# Patient Record
Sex: Male | Born: 1990 | Race: Black or African American | Hispanic: No | Marital: Single | Smoking: Current every day smoker
Health system: Southern US, Community
[De-identification: ages and names within clinical notes are randomized; demographics above are authoritative.]

## PROBLEM LIST (undated history)

## (undated) HISTORY — PX: MULTIPLE TOOTH EXTRACTIONS: SHX2053

---

## 2013-11-22 ENCOUNTER — Emergency Department (HOSPITAL_COMMUNITY)
Admission: EM | Admit: 2013-11-22 | Discharge: 2013-11-22 | Disposition: A | Discharge status: Home / Self Care | Payer: BC Managed Care – PPO | Attending: Emergency Medicine | Admitting: Emergency Medicine

## 2013-11-22 ENCOUNTER — Encounter (HOSPITAL_COMMUNITY): Payer: Self-pay | Admitting: Emergency Medicine

## 2013-11-22 DIAGNOSIS — S91332A Puncture wound without foreign body, left foot, initial encounter: Secondary | ICD-10-CM | POA: Insufficient documentation

## 2013-11-22 DIAGNOSIS — Z23 Encounter for immunization: Secondary | ICD-10-CM | POA: Insufficient documentation

## 2013-11-22 DIAGNOSIS — Y9389 Activity, other specified: Secondary | ICD-10-CM | POA: Insufficient documentation

## 2013-11-22 DIAGNOSIS — Y9289 Other specified places as the place of occurrence of the external cause: Secondary | ICD-10-CM | POA: Diagnosis not present

## 2013-11-22 DIAGNOSIS — W450XXA Nail entering through skin, initial encounter: Secondary | ICD-10-CM | POA: Diagnosis not present

## 2013-11-22 MED ORDER — CEPHALEXIN 500 MG PO CAPS: 500.0000 mg | ORAL_CAPSULE | Freq: Four times a day (QID) | ORAL | Status: DC

## 2013-11-22 MED ORDER — TETANUS-DIPHTH-ACELL PERTUSSIS 5-2.5-18.5 LF-MCG/0.5 IM SUSP
0.5000 mL | Freq: Once | INTRAMUSCULAR | Status: AC
  Administered 2013-11-22: 0.5 mL via INTRAMUSCULAR
  Filled 2013-11-22: qty 0.5

## 2013-11-22 NOTE — ED Notes (Signed)
Pt presents with puncture wound to L foot after stepping on board with rusted nail. No FB retained per pt. Tdap unkn

## 2013-11-22 NOTE — ED Provider Notes (Signed)
CSN: [id number]     Arrival date & time 11/22/13  1936 History   First MD Initiated Contact with Patient 11/22/13 2036     This chart was scribed for non-physician practitioner, Antony MaduraKelly Triva Hueber, PA-C working with Audree CamelScott T Goldston, MD by Arlan OrganAshley Leger, ED Scribe. This patient was seen in room WTR5/WTR5 and the patient's care was started at 9:04 PM.   Chief Complaint  Patient presents with  . Puncture Wound   The history is provided by the patient. No language interpreter was used.    HPI Comments: Lee Scott is a 23 y.o. male who presents to the Emergency Department here for a puncture wound to the bottom of the L foot sustained just prior to arrival. Pt states he stepped on a rusty nail with shoes on attached to a board. He admits to mild, nonradiating pain to the site. No pallor, numbness, or weakness. Pt unaware of last tetanus shot. No known allergies to medications. No other concerns this visit.  History reviewed. No pertinent past medical history. History reviewed. No pertinent past surgical history. No family history on file. History  Substance Use Topics  . Smoking status: Never Smoker   . Smokeless tobacco: Not on file  . Alcohol Use: Yes    Review of Systems  Skin: Positive for wound.  All other systems reviewed and are negative.   Allergies  Review of patient's allergies indicates no known allergies.  Home Medications   Prior to Admission medications   Medication Sig Start Date End Date Taking? Authorizing Provider  cephALEXin (KEFLEX) 500 MG capsule Take 1 capsule (500 mg total) by mouth 4 (four) times daily. 11/22/13   Antony MaduraKelly Marcile Fuquay, PA-C   Triage Vitals: BP 170/92  Pulse 93  Temp(Src) 98.8 F (37.1 C) (Oral)  Resp 16  Ht 6' (1.829 m)  Wt 155 lb (70.308 kg)  BMI 21.02 kg/m2  SpO2 100%   Physical Exam  Nursing note and vitals reviewed. Constitutional: He is oriented to person, place, and time. He appears well-developed and well-nourished. No distress.   Nontoxic/nonseptic appearing  HENT:  Head: Normocephalic and atraumatic.  Eyes: Conjunctivae and EOM are normal. No scleral icterus.  Neck: Normal range of motion. Neck supple.  Cardiovascular: Normal rate, regular rhythm and intact distal pulses.   DP and PT pulses 2+ in LLE  Pulmonary/Chest: Effort normal. No respiratory distress.  Musculoskeletal: Normal range of motion. He exhibits tenderness.       Left foot: He exhibits tenderness. He exhibits normal range of motion, no bony tenderness, normal capillary refill, no crepitus and no deformity.       Feet:  Mild TTP at puncture site; punctate wound to plantar L midfoot. No active bleeding.  Neurological: He is alert and oriented to person, place, and time. He exhibits normal muscle tone. Coordination normal.  Sensation to light touch intact in LLE. Patient able to wiggle all toes of L foot.  Skin: Skin is warm and dry. No rash noted. He is not diaphoretic. No erythema. No pallor.  Psychiatric: He has a normal mood and affect. His behavior is normal.    ED Course  Procedures (including critical care time)  DIAGNOSTIC STUDIES: Oxygen Saturation is 100% on RA, Normal by my interpretation.    COORDINATION OF CARE: 9:04 PM- Will start on course of Keflex. Discussed treatment plan with pt at bedside and pt agreed to plan.     Labs Review Labs Reviewed - No data to display  Imaging Review  No results found.   EKG Interpretation None      MDM   Final diagnoses:  Puncture wound of foot, left, initial encounter    23 year old male presents to the emergency department for further evaluation of puncture wound to his left foot. Patient neurovascularly intact. No sensory deficits appreciated. Patient ambulatory with normal gait. Puncture site appreciated without foreign bodies. There is mild tenderness to palpation at puncture site. No evidence of secondary infection. No indication for emergent imaging. Tetanus updated in ED.  Patient will be discharged on course of Keflex given dirty nature of wound. Wound care provided in ED prior to discharge. Return precautions discussed and provided. Patient agreeable to plan with no unaddressed concerns.  I personally performed the services described in this documentation, which was scribed in my presence. The recorded information has been reviewed and is accurate.    Filed Vitals:   11/22/13 2032  BP: 170/92  Pulse: 93  Temp: 98.8 F (37.1 C)  TempSrc: Oral  Resp: 16  Height: 6' (1.829 m)  Weight: 155 lb (70.308 kg)  SpO2: 100%     Antony MaduraKelly Inessa Wardrop, PA-C 11/22/13 2109

## 2013-11-22 NOTE — Discharge Instructions (Signed)
Puncture Wound °A puncture wound is an injury that extends through all layers of the skin and into the tissue beneath the skin (subcutaneous tissue). Puncture wounds become infected easily because germs often enter the body and go beneath the skin during the injury. Having a deep wound with a small entrance point makes it difficult for your caregiver to adequately clean the wound. This is especially true if you have stepped on a nail and it has passed through a dirty shoe or other situations where the wound is obviously contaminated. °CAUSES  °Many puncture wounds involve glass, nails, splinters, fish hooks, or other objects that enter the skin (foreign bodies). A puncture wound may also be caused by a human bite or animal bite. °DIAGNOSIS  °A puncture wound is usually diagnosed by your history and a physical exam. You may need to have an X-ray or an ultrasound to check for any foreign bodies still in the wound. °TREATMENT  °· Your caregiver will clean the wound as thoroughly as possible. Depending on the location of the wound, a bandage (dressing) may be applied. °· Your caregiver might prescribe antibiotic medicines. °· You may need a follow-up visit to check on your wound. Follow all instructions as directed by your caregiver. °HOME CARE INSTRUCTIONS  °· Change your dressing once per day, or as directed by your caregiver. If the dressing sticks, it may be removed by soaking the area in water. °· If your caregiver has given you follow-up instructions, it is very important that you return for a follow-up appointment. Not following up as directed could result in a chronic or permanent injury, pain, and disability. °· Only take over-the-counter or prescription medicines for pain, discomfort, or fever as directed by your caregiver. °· If you are given antibiotics, take them as directed. Finish them even if you start to feel better. °You may need a tetanus shot if: °· You cannot remember when you had your last tetanus  shot. °· You have never had a tetanus shot. °If you got a tetanus shot, your arm may swell, get red, and feel warm to the touch. This is common and not a problem. If you need a tetanus shot and you choose not to have one, there is a rare chance of getting tetanus. Sickness from tetanus can be serious. °You may need a rabies shot if an animal bite caused your puncture wound. °SEEK MEDICAL CARE IF:  °· You have redness, swelling, or increasing pain in the wound. °· You have red streaks going away from the wound. °· You notice a bad smell coming from the wound or dressing. °· You have yellowish-white fluid (pus) coming from the wound. °· You are treated with an antibiotic for infection, but the infection is not getting better. °· You notice something in the wound, such as rubber from your shoe, cloth, or another object. °· You have a fever. °· You have severe pain. °· You have difficulty breathing. °· You feel dizzy or faint. °· You cannot stop vomiting. °· You lose feeling, develop numbness, or cannot move a limb below the wound. °· Your symptoms worsen. °MAKE SURE YOU: °· Understand these instructions. °· Will watch your condition. °· Will get help right away if you are not doing well or get worse. °Document Released: 10/27/2004 Document Revised: 04/11/2011 Document Reviewed: 07/06/2010 °[employer]® Patient Information ©2015 [employer]. This information is not intended to replace advice given to you by your health care provider. Make sure you discuss any questions you   have with your health care provider. ° °

## 2013-11-24 NOTE — ED Provider Notes (Signed)
Medical screening examination/treatment/procedure(s) were performed by non-physician practitioner and as supervising physician I was immediately available for consultation/collaboration.   EKG Interpretation None        Audree CamelScott T Jeyson Deshotel, MD 11/24/13 2345

## 2016-08-14 ENCOUNTER — Encounter (HOSPITAL_COMMUNITY): Payer: Self-pay | Admitting: Emergency Medicine

## 2016-08-14 ENCOUNTER — Emergency Department (HOSPITAL_COMMUNITY): Payer: Self-pay

## 2016-08-14 ENCOUNTER — Emergency Department (HOSPITAL_COMMUNITY)
Admission: EM | Admit: 2016-08-14 | Discharge: 2016-08-14 | Disposition: A | Discharge status: Home / Self Care | Payer: Self-pay | Attending: Emergency Medicine | Admitting: Emergency Medicine

## 2016-08-14 DIAGNOSIS — S82851A Displaced trimalleolar fracture of right lower leg, initial encounter for closed fracture: Secondary | ICD-10-CM | POA: Insufficient documentation

## 2016-08-14 DIAGNOSIS — Y999 Unspecified external cause status: Secondary | ICD-10-CM | POA: Insufficient documentation

## 2016-08-14 DIAGNOSIS — Y929 Unspecified place or not applicable: Secondary | ICD-10-CM | POA: Insufficient documentation

## 2016-08-14 DIAGNOSIS — Y9351 Activity, roller skating (inline) and skateboarding: Secondary | ICD-10-CM | POA: Insufficient documentation

## 2016-08-14 DIAGNOSIS — W010XXA Fall on same level from slipping, tripping and stumbling without subsequent striking against object, initial encounter: Secondary | ICD-10-CM | POA: Insufficient documentation

## 2016-08-14 DIAGNOSIS — S9304XA Dislocation of right ankle joint, initial encounter: Secondary | ICD-10-CM | POA: Insufficient documentation

## 2016-08-14 MED ORDER — HYDROMORPHONE HCL 1 MG/ML IJ SOLN
1.0000 mg | Freq: Once | INTRAMUSCULAR | Status: AC
  Administered 2016-08-14: 1 mg via INTRAVENOUS
  Filled 2016-08-14: qty 1

## 2016-08-14 MED ORDER — HYDROCODONE-ACETAMINOPHEN 5-325 MG PO TABS: 1.0000 | ORAL_TABLET | Freq: Three times a day (TID) | ORAL | 0 refills | Status: AC | PRN

## 2016-08-14 MED ORDER — ACETAMINOPHEN 500 MG PO TABS: 1000.0000 mg | ORAL_TABLET | Freq: Three times a day (TID) | ORAL | 0 refills | Status: AC

## 2016-08-14 MED ORDER — LIDOCAINE HCL 2 % IJ SOLN
20.0000 mL | Freq: Once | INTRAMUSCULAR | Status: AC
  Administered 2016-08-14: 400 mg
  Filled 2016-08-14: qty 20

## 2016-08-14 MED ORDER — HYDROMORPHONE HCL 1 MG/ML IJ SOLN
1.0000 mg | Freq: Once | INTRAMUSCULAR | Status: AC
Start: 2016-08-14 — End: 2016-08-14
  Administered 2016-08-14: 1 mg via INTRAVENOUS
  Filled 2016-08-14: qty 1

## 2016-08-14 NOTE — ED Provider Notes (Signed)
[facility]-EMERGENCY DEPT Provider Note   CSN: [id number] Arrival date & time: 08/14/16  1646     History   Chief Complaint Chief Complaint  Patient presents with  . Ankle Injury    HPI Lee Scott is a 26 y.o. male.  HPI  26 year old male with no pertinent past medical history presents to the ED with right ankle injury. He reports that he was skating and fell trying to perform a trach. Reported twisting his ankle and hearing it pop. Denied any other injuries or complaints. Denies any head trauma or loss of consciousness. Complains only of the right ankle pain which is exacerbated with movement and palpation. No alleviating factors. Denies any other physical complaints.   History reviewed. No pertinent past medical history.  There are no active problems to display for this patient.   History reviewed. No pertinent surgical history.     Home Medications    Prior to Admission medications   Medication Sig Start Date End Date Taking? Authorizing Provider  acetaminophen (TYLENOL) 500 MG tablet Take 2 tablets (1,000 mg total) by mouth every 8 (eight) hours. Do not take more than 4000 mg of acetaminophen (Tylenol) in a 24-hour period. Please note that other medicines that you may be prescribed may have Tylenol as well. 08/14/16 08/19/16  Nira Connardama, Pedro Eduardo, MD  cephALEXin (KEFLEX) 500 MG capsule Take 1 capsule (500 mg total) by mouth 4 (four) times daily. 11/22/13   Antony MaduraHumes, Kelly, PA-C  HYDROcodone-acetaminophen (NORCO/VICODIN) 5-325 MG tablet Take 1 tablet by mouth every 8 (eight) hours as needed for severe pain (That is not improved by your scheduled acetaminophen regimen). Please do not exceed 4000 mg of acetaminophen (Tylenol) a 24-hour period. Please note that he may be prescribed additional medicine that contains acetaminophen. 08/14/16 08/19/16  Nira Connardama, Pedro Eduardo, MD    Family History History reviewed. No pertinent family history.  Social History Social History    Substance Use Topics  . Smoking status: Never Smoker  . Smokeless tobacco: Not on file  . Alcohol use Yes     Allergies   Patient has no known allergies.   Review of Systems Review of Systems All other systems are reviewed and are negative for acute change except as noted in the HPI   Physical Exam Updated Vital Signs BP (!) 150/95 (BP Location: Left Arm)   Pulse (!) 103   Temp 98.1 F (36.7 C) (Oral)   Resp 18   SpO2 100%   Physical Exam  Constitutional: He is oriented to person, place, and time. He appears well-developed and well-nourished. No distress.  HENT:  Head: Normocephalic.  Right Ear: External ear normal.  Left Ear: External ear normal.  Mouth/Throat: Oropharynx is clear and moist.  Eyes: Pupils are equal, round, and reactive to light. Conjunctivae and EOM are normal. Right eye exhibits no discharge. Left eye exhibits no discharge. No scleral icterus.  Neck: Normal range of motion. Neck supple.  Cardiovascular: Regular rhythm and normal heart sounds.  Exam reveals no gallop and no friction rub.   No murmur heard. Pulses:      Radial pulses are 2+ on the right side, and 2+ on the left side.       Dorsalis pedis pulses are 2+ on the right side, and 2+ on the left side.  Pulmonary/Chest: Effort normal and breath sounds normal. No stridor. No respiratory distress.  Abdominal: Soft. He exhibits no distension. There is no tenderness.  Musculoskeletal:       Right  ankle: He exhibits decreased range of motion, swelling and deformity. He exhibits normal pulse. Tenderness. Lateral malleolus, medial malleolus and AITFL tenderness found.       Cervical back: He exhibits no bony tenderness.       Thoracic back: He exhibits no bony tenderness.       Lumbar back: He exhibits no bony tenderness.       Feet:  Clavicle stable. Chest stable to AP/Lat compression. Pelvis stable to Lat compression. No chest or abdominal wall contusion.  Neurological: He is alert and  oriented to person, place, and time. GCS eye subscore is 4. GCS verbal subscore is 5. GCS motor subscore is 6.  Moving all extremities   Skin: Skin is warm. No abrasion and no laceration noted. He is not diaphoretic.     ED Treatments / Results  Labs (all labs ordered are listed, but only abnormal results are displayed) Labs Reviewed - No data to display  EKG  EKG Interpretation None       Radiology Dg Tibia/fibula Right  Result Date: 08/14/2016 CLINICAL DATA:  Recent fall from skateboard with ankle deformity, initial encounter EXAM: RIGHT TIBIA AND FIBULA - 2 VIEW COMPARISON:  None. FINDINGS: Fracture dislocation of the right ankle joint is again identified with posterior displacement of the talus with respect to the distal tibia. Distal fibular and medial malleolar fractures are again seen. No proximal abnormality is noted. IMPRESSION: Fracture dislocation of the right ankle joint as previously described. Electronically Signed   By: Alcide Clever M.D.   On: 08/14/2016 17:58   Dg Ankle Complete Right  Result Date: 08/14/2016 CLINICAL DATA:  Status post reduction of right ankle dislocation EXAM: RIGHT ANKLE - COMPLETE 3+ VIEW COMPARISON:  Right ankle radiographs from earlier today FINDINGS: Overlying cast obscures fine bone detail. No residual malalignment at the right ankle joint. Re- demonstration of the oblique right distal fibula fracture with minimal 3 mm posterior displacement of the distal fracture fragment. Nondisplaced posterior malleolar right distal tibia fracture. Nondisplaced medial malleolus fracture. No suspicious focal osseous lesion. IMPRESSION: 1. Successful reduction, with no residual malalignment at the right ankle joint. 2. Trimalleolar right ankle fractures with minimal residual displacement of the right distal fibula fracture as detailed. Electronically Signed   By: Delbert Phenix M.D.   On: 08/14/2016 19:37   Dg Ankle Complete Right  Result Date: 08/14/2016 CLINICAL  DATA:  Slip and fall all off of skateboard with right ankle deformity, initial encounter EXAM: RIGHT ANKLE - COMPLETE 3+ VIEW COMPARISON:  None. FINDINGS: There is an oblique fracture through the distal fibular diaphysis with some impaction at the fracture site. Fracture through the lateral malleolus is also noted. There is posterior dislocation of the talus with respect to the distal tibia. No other fracture is identified. IMPRESSION: Fracture dislocation of the right ankle joint with posterior displacement of the talus with respect to the distal tibia. Distal tibial and fibular fractures are seen. Electronically Signed   By: Alcide Clever M.D.   On: 08/14/2016 17:57   Dg Foot Complete Right  Result Date: 08/14/2016 CLINICAL DATA:  Recent fall with right ankle fracture dislocation, initial encounter EXAM: RIGHT FOOT COMPLETE - 3+ VIEW COMPARISON:  None. FINDINGS: Posterior dislocation of the talus with respect to the distal tibia is again seen. No other fracture is noted within the foot. IMPRESSION: Fracture dislocation of the right ankle joint as previously described. No definitive foot fractures are seen. Electronically Signed   By: Loraine Leriche  Lukens M.D.   On: 08/14/2016 18:00    Procedures ORTHOPEDIC INJURY TREATMENT Date/Time: 08/14/2016 7:58 PM Performed by: Nira Conn Authorized by: Nira Conn  Consent: Verbal consent obtained. Consent given by: patient Patient understanding: patient states understanding of the procedure being performed Patient consent: the patient's understanding of the procedure matches consent given Patient identity confirmed: verbally with patient Time out: Immediately prior to procedure a "time out" was called to verify the correct patient, procedure, equipment, support staff and site/side marked as required. Injury location: ankle Location details: right ankle Injury type: fracture-dislocation Pre-procedure neurovascular assessment: neurovascularly  intact Anesthesia: hematoma block  Anesthesia: Local anesthesia used: yes Local Anesthetic: lidocaine 2% without epinephrine Anesthetic total: 20 mL Manipulation performed: yes Skeletal traction used: yes Reduction successful: yes X-ray confirmed reduction: yes Immobilization: splint Splint type: Posterior/stirrup. Supplies used: cotton padding and plaster Post-procedure neurovascular assessment: post-procedure neurovascularly intact Patient tolerance: Patient tolerated the procedure well with no immediate complications    (including critical care time)  SPLINT APPLICATION Authorized by: Amadeo Garnet Cardama Consent: Verbal consent obtained. Risks and benefits: risks, benefits and alternatives were discussed Consent given by: patient Splint applied by: Myself and orthopedic technician Location details: Right ankle  Splint type: Posterior/stirrup  Supplies used: Plaster  Post-procedure: The splinted body part was neurovascularly unchanged following the procedure. Patient tolerance: Patient tolerated the procedure well with no immediate complications.     Medications Ordered in ED Medications  HYDROmorphone (DILAUDID) injection 1 mg (1 mg Intravenous Given 08/14/16 1705)  HYDROmorphone (DILAUDID) injection 1 mg (1 mg Intravenous Given 08/14/16 1750)  lidocaine (XYLOCAINE) 2 % (with pres) injection 400 mg (400 mg Other Given by Other 08/14/16 1857)  HYDROmorphone (DILAUDID) injection 1 mg (1 mg Intravenous Given 08/14/16 1844)     Initial Impression / Assessment and Plan / ED Course  I have reviewed the triage vital signs and the nursing notes.  Pertinent labs & imaging results that were available during my care of the patient were reviewed by me and considered in my medical decision making (see chart for details).     Closed displaced trimalleolar fracture dislocation. Neurovascularly intact distally. Patient provided with IV pain medicine. Hematoma block provided and  fracture dislocation was successfully reduced. Confirmed with plain film. Patient was splinted with plaster and provided with crutches. He was instructed to follow-up with orthopedic surgery for further assessment and management as needed.    Final Clinical Impressions(s) / ED Diagnoses   Final diagnoses:  Closed fracture of ankle, trimalleolar, right, initial encounter  Dislocation of right ankle joint, initial encounter   Disposition: Discharge  Condition: Good  I have discussed the results, Dx and Tx plan with the patient who expressed understanding and agree(s) with the plan. Discharge instructions discussed at great length. The patient was given strict return precautions who verbalized understanding of the instructions. No further questions at time of discharge.    New Prescriptions   ACETAMINOPHEN (TYLENOL) 500 MG TABLET    Take 2 tablets (1,000 mg total) by mouth every 8 (eight) hours. Do not take more than 4000 mg of acetaminophen (Tylenol) in a 24-hour period. Please note that other medicines that you may be prescribed may have Tylenol as well.   HYDROCODONE-ACETAMINOPHEN (NORCO/VICODIN) 5-325 MG TABLET    Take 1 tablet by mouth every 8 (eight) hours as needed for severe pain (That is not improved by your scheduled acetaminophen regimen). Please do not exceed 4000 mg of acetaminophen (Tylenol) a 24-hour period. Please  note that he may be prescribed additional medicine that contains acetaminophen.    Follow Up: Kathryne Hitch, MD [address] [place] [address] [phone]  Schedule an appointment as soon as possible for a visit in 3 days For close follow up to assess for right ankle fracture      Cardama, Amadeo Garnet, MD 08/14/16 [phone]

## 2016-08-14 NOTE — ED Notes (Signed)
Patient transported to X-ray 

## 2016-08-14 NOTE — ED Triage Notes (Signed)
Pt c/o right ankle and deformity onset today after slip and fall. Right ankle deformed, pointed laterally. No head injury or LOC 2+ pedal pulse bilateral. Motor function decreased but intact. No sensation to foot.

## 2016-08-14 NOTE — Discharge Instructions (Signed)
Did not weight-bear on your right foot. Please follow-up with orthopedic surgery and 3-5 days for evaluation and further management.

## 2016-08-17 ENCOUNTER — Ambulatory Visit (INDEPENDENT_AMBULATORY_CARE_PROVIDER_SITE_OTHER): Payer: Self-pay | Admitting: Orthopaedic Surgery

## 2016-08-17 ENCOUNTER — Encounter (INDEPENDENT_AMBULATORY_CARE_PROVIDER_SITE_OTHER): Payer: Self-pay | Admitting: Orthopaedic Surgery

## 2016-08-17 DIAGNOSIS — S82851A Displaced trimalleolar fracture of right lower leg, initial encounter for closed fracture: Secondary | ICD-10-CM | POA: Insufficient documentation

## 2016-08-17 NOTE — Progress Notes (Signed)
Office Visit Note   Patient: Lee CosierChristopher Scott           Date of Birth: 02/13/1990           MRN: [id number] Visit Date: 08/17/2016              Requested by: No referring provider defined for this encounter. PCP: Patient, No Pcp Per   Assessment & Plan: Visit Diagnoses:  1. Displaced trimalleolar fracture of right lower leg, initial encounter for closed fracture     Plan: I showed him his x-rays and a model and ankle and explained in detail the need for surgery on this unstable ankle fracture dislocation. We had a thorough discussion of his intraoperative and postoperative course as well as the risks and benefits of surgery and nonsurgical versus surgical treatment. He understands that given the instability of this that surgical treatment is the optimum course to take. However there is risk of infection and there is always risk posterior medical arthritic changes. He is a Advice workerskateboarder and understands that I would not let him back on a skateboard for probably 8-12 weeks. 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 We'll we do see him back in 2 weeks postoperative to get the sutures out we will get a repeat 3 views of his right ankle out of any type of splint. All questions were encouraged and answered.  Follow-Up Instructions: Return for 2 weeks post-op.   Orders:  No orders of the defined types were placed in this encounter.  No orders of the defined types were placed in this encounter.     Procedures: No procedures performed   Clinical Data: No additional findings.   Subjective: Chief Complaint  Patient presents with  . Right Ankle - Pain, Fracture, Injury  The patient is very pleasant 26 year old who injured his right ankle 3 days ago after wrecking his skateboard. He sustained a trimalleolar ankle fracture dislocation of the right ankle and was reduced anatomically by the ER staff and placed appropriate in a splint. He reports  only mild pain. However the splint appears to be dirty. He is otherwise someone who is healthy and works in Aeronautical engineerlandscaping. He only reports right ankle discomfort.  HPI  Review of Systems He currently denies any headache, chest pain, fever, chills, nausea, vomiting, shortness of breath  Objective: Vital Signs: There were no vitals taken for this visit.  Physical Exam He is alert or 3 and in no acute distress Ortho Exam Examination of the right ankle out of the splint shows a located right ankle is neurovascular intact but significant swelling throughout the ankle. Specialty Comments:  No specialty comments available.  Imaging: No results found. I independently reviewed x-rays of his right ankle and it does show pre-and postreduction x-rays of his severe trimalleolar ankle fracture dislocation. This mainly involves the medial lateral malleolus. It's hard to tell if there is a posterior lip of the tibia fracture.  PMFS History: Patient Active Problem List   Diagnosis Date Noted  . Displaced trimalleolar fracture of right lower leg, initial encounter for closed fracture 08/17/2016   No past medical history on file.  No family history on file.  No past surgical history on file. Social History   Occupational History  . Not on file.   Social History Main Topics  . Smoking status: Current Every Day Smoker  . Smokeless tobacco: Never Used  . Alcohol use Yes  . Drug  use: No  . Sexual activity: Not on file

## 2016-08-18 NOTE — Pre-Procedure Instructions (Signed)
    Lee CosierChristopher Scott  08/18/2016     No Pharmacies Listed   Your procedure is scheduled on Tuesday July 24.  [facility] Ad[facility] at 10:15 A.M.  Call this number if you have problems the morning of surgery:  [phone]   Remember:  Do not eat food or drink liquids after midnight.  Take these medicines the morning of surgery with A SIP OF WATER: acetaminophen (tylenol) OR hydrocodone-acetaminophen (vicodin) if needed  7 days prior to surgery STOP taking any Aspirin, Aleve, Naproxen, Ibuprofen, Motrin, Advil, Goody's, BC's, all herbal medications, fish oil, and all vitamins    Do not wear jewelry  Do not wear lotions, powders, or colognes, or deoderant.   Men may shave face and neck.  Do not bring valuables to the hospital.  [facility] any belongings or valuables.  Contacts, dentures or bridgework may not be worn into surgery.  Leave your suitcase in the car.  After surgery it may be brought to your room.  For patients admitted to the hospital, discharge time will be determined by your treatment team.  Patients discharged the day of surgery will not be allowed to drive home.    Special instructions:    [place]- Preparing For Surgery  Before surgery, you can play an important role. Because skin is not sterile, your skin needs to be as free of germs as possible. You can reduce the number of germs on your skin by washing with CHG (chlorahexidine gluconate) Soap before surgery.  CHG is an antiseptic cleaner which kills germs and bonds with the skin to continue killing germs even after washing.  Please do not use if you have an allergy to CHG or antibacterial soaps. If your skin becomes reddened/irritated stop using the CHG.  Do not shave (including legs and underarms) for at least 48 hours prior to first CHG shower. It is OK to shave your face.  Please follow these instructions carefully.   1. Shower the NIGHT BEFORE SURGERY  and the MORNING OF SURGERY with CHG.   2. If you chose to wash your hair, wash your hair first as usual with your normal shampoo.  3. After you shampoo, rinse your hair and body thoroughly to remove the shampoo.  4. Use CHG as you would any other liquid soap. You can apply CHG directly to the skin and wash gently with a scrungie or a clean washcloth.   5. Apply the CHG Soap to your body ONLY FROM THE NECK DOWN.  Do not use on open wounds or open sores. Avoid contact with your eyes, ears, mouth and genitals (private parts). Wash genitals (private parts) with your normal soap.  6. Wash thoroughly, paying special attention to the area where your surgery will be performed.  7. Thoroughly rinse your body with warm water from the neck down.  8. DO NOT shower/wash with your normal soap after using and rinsing off the CHG Soap.  9. Pat yourself dry with a CLEAN TOWEL.   10. Wear CLEAN PAJAMAS   11. Place CLEAN SHEETS on your bed the night of your first shower and DO NOT SLEEP WITH PETS.    Day of Surgery: Do not apply any deodorants/lotions. Please wear clean clothes to the hospital/surgery center.      Please read over the following fact sheets that you were given. MRSA Information

## 2016-08-19 ENCOUNTER — Encounter (HOSPITAL_COMMUNITY): Payer: Self-pay

## 2016-08-19 ENCOUNTER — Encounter (HOSPITAL_COMMUNITY)
Admission: RE | Admit: 2016-08-19 | Discharge: 2016-08-19 | Disposition: A | Discharge status: Home / Self Care | Payer: Self-pay | Source: Ambulatory Visit | Attending: Orthopaedic Surgery | Admitting: Orthopaedic Surgery

## 2016-08-19 DIAGNOSIS — Z01812 Encounter for preprocedural laboratory examination: Secondary | ICD-10-CM | POA: Insufficient documentation

## 2016-08-19 LAB — BASIC METABOLIC PANEL
Anion gap: 7 (ref 5–15)
BUN: 11 mg/dL (ref 6–20)
CHLORIDE: 104 mmol/L (ref 101–111)
CO2: 28 mmol/L (ref 22–32)
Calcium: 9.2 mg/dL (ref 8.9–10.3)
Creatinine, Ser: 0.98 mg/dL (ref 0.61–1.24)
Glucose, Bld: 103 mg/dL — ABNORMAL HIGH (ref 65–99)
Potassium: 4.5 mmol/L (ref 3.5–5.1)
SODIUM: 139 mmol/L (ref 135–145)

## 2016-08-19 LAB — CBC
HEMATOCRIT: 44.6 % (ref 39.0–52.0)
Hemoglobin: 14.6 g/dL (ref 13.0–17.0)
MCH: 27.7 pg (ref 26.0–34.0)
MCHC: 32.7 g/dL (ref 30.0–36.0)
MCV: 84.5 fL (ref 78.0–100.0)
PLATELETS: 302 10*3/uL (ref 150–400)
RBC: 5.28 MIL/uL (ref 4.22–5.81)
RDW: 13.3 % (ref 11.5–15.5)
WBC: 4.9 10*3/uL (ref 4.0–10.5)

## 2016-08-19 LAB — SURGICAL PCR SCREEN
MRSA, PCR: NEGATIVE
Staphylococcus aureus: POSITIVE — AB

## 2016-08-19 NOTE — Progress Notes (Signed)
PCP: pt denies  Cardiologist: pt denies  EKG: pt denies  Stress test: pt denies  ECHO: pt denies  Cardiac Cath: pt denies  Chest x-ray: pt denies

## 2016-08-22 ENCOUNTER — Other Ambulatory Visit (INDEPENDENT_AMBULATORY_CARE_PROVIDER_SITE_OTHER): Payer: Self-pay | Admitting: Orthopaedic Surgery

## 2016-08-22 DIAGNOSIS — S82851A Displaced trimalleolar fracture of right lower leg, initial encounter for closed fracture: Secondary | ICD-10-CM

## 2016-08-23 ENCOUNTER — Ambulatory Visit (HOSPITAL_COMMUNITY)
Admission: RE | Admit: 2016-08-23 | Discharge: 2016-08-23 | Disposition: A | Discharge status: Home / Self Care | Payer: Self-pay | Source: Ambulatory Visit | Attending: Orthopaedic Surgery | Admitting: Orthopaedic Surgery

## 2016-08-23 ENCOUNTER — Ambulatory Visit (HOSPITAL_COMMUNITY): Payer: Self-pay | Admitting: Anesthesiology

## 2016-08-23 ENCOUNTER — Encounter (HOSPITAL_COMMUNITY)
Admission: RE | Disposition: A | Discharge status: Home / Self Care | Payer: Self-pay | Source: Ambulatory Visit | Attending: Orthopaedic Surgery

## 2016-08-23 ENCOUNTER — Encounter (HOSPITAL_COMMUNITY): Payer: Self-pay | Admitting: Urology

## 2016-08-23 ENCOUNTER — Ambulatory Visit (HOSPITAL_COMMUNITY): Payer: Self-pay

## 2016-08-23 DIAGNOSIS — Y92838 Other recreation area as the place of occurrence of the external cause: Secondary | ICD-10-CM | POA: Insufficient documentation

## 2016-08-23 DIAGNOSIS — Y9351 Activity, roller skating (inline) and skateboarding: Secondary | ICD-10-CM | POA: Insufficient documentation

## 2016-08-23 DIAGNOSIS — S82851A Displaced trimalleolar fracture of right lower leg, initial encounter for closed fracture: Secondary | ICD-10-CM | POA: Insufficient documentation

## 2016-08-23 DIAGNOSIS — F17210 Nicotine dependence, cigarettes, uncomplicated: Secondary | ICD-10-CM | POA: Insufficient documentation

## 2016-08-23 DIAGNOSIS — Z419 Encounter for procedure for purposes other than remedying health state, unspecified: Secondary | ICD-10-CM

## 2016-08-23 HISTORY — PX: ORIF ANKLE FRACTURE: SHX5408

## 2016-08-23 SURGERY — OPEN REDUCTION INTERNAL FIXATION (ORIF) ANKLE FRACTURE
Anesthesia: General | Site: Ankle | Laterality: Right

## 2016-08-23 MED ORDER — LIDOCAINE HCL (CARDIAC) 20 MG/ML IV SOLN
INTRAVENOUS | Status: DC | PRN
  Administered 2016-08-23: 60 mg via INTRAVENOUS

## 2016-08-23 MED ORDER — DEXAMETHASONE SODIUM PHOSPHATE 10 MG/ML IJ SOLN
INTRAMUSCULAR | Status: DC | PRN
  Administered 2016-08-23: 10 mg via INTRAVENOUS

## 2016-08-23 MED ORDER — FENTANYL CITRATE (PF) 100 MCG/2ML IJ SOLN
100.0000 ug | Freq: Once | INTRAMUSCULAR | Status: AC
  Administered 2016-08-23: 100 ug via INTRAVENOUS
  Filled 2016-08-23: qty 2

## 2016-08-23 MED ORDER — CEFAZOLIN SODIUM-DEXTROSE 2-4 GM/100ML-% IV SOLN
INTRAVENOUS | Status: AC
  Filled 2016-08-23: qty 100

## 2016-08-23 MED ORDER — LIDOCAINE 2% (20 MG/ML) 5 ML SYRINGE
INTRAMUSCULAR | Status: AC
  Filled 2016-08-23: qty 5

## 2016-08-23 MED ORDER — FENTANYL CITRATE (PF) 100 MCG/2ML IJ SOLN
INTRAMUSCULAR | Status: AC
  Administered 2016-08-23: 100 ug via INTRAVENOUS
  Filled 2016-08-23: qty 2

## 2016-08-23 MED ORDER — CEFAZOLIN SODIUM-DEXTROSE 2-4 GM/100ML-% IV SOLN
2.0000 g | INTRAVENOUS | Status: AC
  Administered 2016-08-23: 2 g via INTRAVENOUS

## 2016-08-23 MED ORDER — OXYCODONE-ACETAMINOPHEN 5-325 MG PO TABS: 1.0000 | ORAL_TABLET | ORAL | 0 refills | Status: AC | PRN

## 2016-08-23 MED ORDER — ONDANSETRON HCL 4 MG/2ML IJ SOLN
INTRAMUSCULAR | Status: AC
  Filled 2016-08-23: qty 2

## 2016-08-23 MED ORDER — FENTANYL CITRATE (PF) 100 MCG/2ML IJ SOLN
INTRAMUSCULAR | Status: DC | PRN
  Administered 2016-08-23 (×2): 50 ug via INTRAVENOUS

## 2016-08-23 MED ORDER — MIDAZOLAM HCL 2 MG/2ML IJ SOLN
INTRAMUSCULAR | Status: AC
  Administered 2016-08-23: 2 mg via INTRAVENOUS
  Filled 2016-08-23: qty 2

## 2016-08-23 MED ORDER — HYDROMORPHONE HCL 1 MG/ML IJ SOLN
0.2500 mg | INTRAMUSCULAR | Status: DC | PRN
Start: 2016-08-23 — End: 2016-08-23

## 2016-08-23 MED ORDER — MIDAZOLAM HCL 2 MG/2ML IJ SOLN
2.0000 mg | Freq: Once | INTRAMUSCULAR | Status: AC
  Administered 2016-08-23: 2 mg via INTRAVENOUS
  Filled 2016-08-23: qty 2

## 2016-08-23 MED ORDER — PROPOFOL 10 MG/ML IV BOLUS
INTRAVENOUS | Status: DC | PRN
  Administered 2016-08-23: 200 mg via INTRAVENOUS

## 2016-08-23 MED ORDER — 0.9 % SODIUM CHLORIDE (POUR BTL) OPTIME
TOPICAL | Status: DC | PRN
  Administered 2016-08-23: 1000 mL

## 2016-08-23 MED ORDER — PROPOFOL 10 MG/ML IV BOLUS
INTRAVENOUS | Status: AC
  Filled 2016-08-23: qty 20

## 2016-08-23 MED ORDER — LACTATED RINGERS IV SOLN
INTRAVENOUS | Status: DC
  Administered 2016-08-23 (×2): via INTRAVENOUS

## 2016-08-23 MED ORDER — ONDANSETRON HCL 4 MG/2ML IJ SOLN
INTRAMUSCULAR | Status: DC | PRN
  Administered 2016-08-23: 4 mg via INTRAVENOUS

## 2016-08-23 MED ORDER — DEXAMETHASONE SODIUM PHOSPHATE 10 MG/ML IJ SOLN
INTRAMUSCULAR | Status: AC
  Filled 2016-08-23: qty 1

## 2016-08-23 MED ORDER — BUPIVACAINE HCL (PF) 0.5 % IJ SOLN
INTRAMUSCULAR | Status: DC | PRN
  Administered 2016-08-23: 10 mL

## 2016-08-23 MED ORDER — BUPIVACAINE HCL (PF) 0.5 % IJ SOLN
INTRAMUSCULAR | Status: DC | PRN
Start: 2016-08-23 — End: 2016-08-23

## 2016-08-23 MED ORDER — FENTANYL CITRATE (PF) 250 MCG/5ML IJ SOLN
INTRAMUSCULAR | Status: AC
  Filled 2016-08-23: qty 5

## 2016-08-23 MED ORDER — BUPIVACAINE-EPINEPHRINE (PF) 0.5% -1:200000 IJ SOLN
INTRAMUSCULAR | Status: DC | PRN
Start: 2016-08-23 — End: 2016-08-23
  Administered 2016-08-23: 30 mL via PERINEURAL

## 2016-08-23 SURGICAL SUPPLY — 65 items
BANDAGE ACE 4X5 VEL STRL LF (GAUZE/BANDAGES/DRESSINGS) ×3 IMPLANT
BANDAGE ACE 6X5 VEL STRL LF (GAUZE/BANDAGES/DRESSINGS) ×3 IMPLANT
BANDAGE ESMARK 6X9 LF (GAUZE/BANDAGES/DRESSINGS) ×1 IMPLANT
BIT DRILL 3.5X122MM AO FIT (BIT) ×3 IMPLANT
BIT DRILL CANN 2.7 (BIT) ×1
BIT DRILL CANN 2.7MM (BIT) ×1
BIT DRILL SRG 2.7XCANN AO CPLG (BIT) ×1 IMPLANT
BIT DRL SRG 2.7XCANN AO CPLNG (BIT) ×1
BNDG ESMARK 6X9 LF (GAUZE/BANDAGES/DRESSINGS) ×3
COVER SURGICAL LIGHT HANDLE (MISCELLANEOUS) ×3 IMPLANT
CUFF TOURNIQUET SINGLE 34IN LL (TOURNIQUET CUFF) ×3 IMPLANT
DRAPE C-ARM 42X72 X-RAY (DRAPES) ×3 IMPLANT
DRAPE C-ARMOR (DRAPES) ×3 IMPLANT
DRAPE U-SHAPE 47X51 STRL (DRAPES) ×3 IMPLANT
DRILL 2.6X122MM WL AO SHAFT (BIT) ×3 IMPLANT
DURAPREP 26ML APPLICATOR (WOUND CARE) ×3 IMPLANT
ELECT REM PT RETURN 9FT ADLT (ELECTROSURGICAL) ×3
ELECTRODE REM PT RTRN 9FT ADLT (ELECTROSURGICAL) ×1 IMPLANT
GAUZE SPONGE 4X4 12PLY STRL (GAUZE/BANDAGES/DRESSINGS) ×3 IMPLANT
GAUZE XEROFORM 5X9 LF (GAUZE/BANDAGES/DRESSINGS) ×3 IMPLANT
GLOVE BIO SURGEON STRL SZ7 (GLOVE) ×3 IMPLANT
GLOVE BIO SURGEON STRL SZ8 (GLOVE) ×3 IMPLANT
GLOVE BIOGEL PI IND STRL 6.5 (GLOVE) ×2 IMPLANT
GLOVE BIOGEL PI IND STRL 7.0 (GLOVE) ×1 IMPLANT
GLOVE BIOGEL PI IND STRL 7.5 (GLOVE) ×1 IMPLANT
GLOVE BIOGEL PI INDICATOR 6.5 (GLOVE) ×4
GLOVE BIOGEL PI INDICATOR 7.0 (GLOVE) ×2
GLOVE BIOGEL PI INDICATOR 7.5 (GLOVE) ×2
GLOVE ORTHO TXT STRL SZ7.5 (GLOVE) ×3 IMPLANT
GLOVE SURG SS PI 6.0 STRL IVOR (GLOVE) ×3 IMPLANT
GOWN STRL REUS W/ TWL LRG LVL3 (GOWN DISPOSABLE) ×2 IMPLANT
GOWN STRL REUS W/ TWL XL LVL3 (GOWN DISPOSABLE) ×1 IMPLANT
GOWN STRL REUS W/TWL LRG LVL3 (GOWN DISPOSABLE) ×4
GOWN STRL REUS W/TWL XL LVL3 (GOWN DISPOSABLE) ×2
K-WIRE 1.4 (WIRE) ×3 IMPLANT
KIT BASIN OR (CUSTOM PROCEDURE TRAY) ×3 IMPLANT
KIT ROOM TURNOVER OR (KITS) ×3 IMPLANT
MANIFOLD NEPTUNE II (INSTRUMENTS) ×3 IMPLANT
NEEDLE HYPO 25GX1X1/2 BEV (NEEDLE) ×3 IMPLANT
NS IRRIG 1000ML POUR BTL (IV SOLUTION) ×3 IMPLANT
PACK ORTHO EXTREMITY (CUSTOM PROCEDURE TRAY) ×3 IMPLANT
PAD ARMBOARD 7.5X6 YLW CONV (MISCELLANEOUS) ×6 IMPLANT
PAD CAST 4YDX4 CTTN HI CHSV (CAST SUPPLIES) ×1 IMPLANT
PADDING CAST COTTON 4X4 STRL (CAST SUPPLIES) ×2
PADDING CAST COTTON 6X4 STRL (CAST SUPPLIES) ×3 IMPLANT
PLATE STR 6HOLE (Plate) ×3 IMPLANT
PREFILTER NEPTUNE (MISCELLANEOUS) ×3 IMPLANT
SCREW 50X4.0MM (Screw) ×3 IMPLANT
SCREW BONE 14MMX3.5MM (Screw) ×6 IMPLANT
SCREW BONE 3.5X16MM (Screw) ×3 IMPLANT
SCREW BONE 3.5X20MM (Screw) ×3 IMPLANT
SCREW BONE NON-LCKING 3.5X12MM (Screw) ×6 IMPLANT
SPONGE LAP 4X18 X RAY DECT (DISPOSABLE) ×3 IMPLANT
SUCTION FRAZIER HANDLE 10FR (MISCELLANEOUS) ×2
SUCTION TUBE FRAZIER 10FR DISP (MISCELLANEOUS) ×1 IMPLANT
SUT ETHILON 2 0 FS 18 (SUTURE) ×6 IMPLANT
SUT VIC AB 0 CT2 27 (SUTURE) ×3 IMPLANT
SUT VIC AB 2-0 CT1 27 (SUTURE) ×4
SUT VIC AB 2-0 CT1 27XBRD (SUTURE) ×1 IMPLANT
SUT VIC AB 2-0 CT1 TAPERPNT 27 (SUTURE) ×1 IMPLANT
SYR CONTROL 10ML LL (SYRINGE) ×3 IMPLANT
TOWEL GREEN STERILE (TOWEL DISPOSABLE) ×3 IMPLANT
TUBE CONNECTING 12'X1/4 (SUCTIONS) ×1
TUBE CONNECTING 12X1/4 (SUCTIONS) ×2 IMPLANT
WATER STERILE IRR 1000ML POUR (IV SOLUTION) IMPLANT

## 2016-08-23 NOTE — Anesthesia Postprocedure Evaluation (Signed)
Anesthesia Post Note  Patient: Kathreen CosierChristopher Englander  Procedure(s) Performed: Procedure(s) (LRB): OPEN REDUCTION INTERNAL FIXATION (ORIF) RIGHT TRIMALLEOLAR ANKLE FRACTURE (Right)     Patient location during evaluation: PACU Anesthesia Type: General and Regional Level of consciousness: awake and alert Pain management: pain level controlled Vital Signs Assessment: post-procedure vital signs reviewed and stable Respiratory status: spontaneous breathing, nonlabored ventilation and respiratory function stable Cardiovascular status: blood pressure returned to baseline and stable Postop Assessment: no signs of nausea or vomiting Anesthetic complications: no    Last Vitals:  Vitals:   08/23/16 1345 08/23/16 1355  BP: 132/89 130/90  Pulse: 70 90  Resp: 14 20  Temp:  36.7 C    Last Pain:  Vitals:   08/23/16 0925  TempSrc: Oral                 Tiago Humphrey,W. EDMOND

## 2016-08-23 NOTE — Op Note (Signed)
Lee Scott, Lee NO.:  [id number]  MEDICAL RECORD NO.:  [id number]  LOCATION:                                 FACILITY:  PHYSICIAN:  Lee Scott, M.D.DATE OF BIRTH:  08/19/1990  DATE OF PROCEDURE:  08/23/2016 DATE OF DISCHARGE:                              OPERATIVE REPORT   PREOPERATIVE DIAGNOSIS:  Right unstable trimalleolar ankle fracture dislocation.  POSTOPERATIVE DIAGNOSIS:  Right unstable trimalleolar ankle fracture dislocation.  PROCEDURE:  Open reduction and internal fixation of right trimalleolar ankle fracture with fixing the lateral and medial malleolus only.  SURGEON:  Lee Scott, M.D.  ANESTHESIA: 1. Right lower extremity regional block. 2. General.  TOURNIQUET TIME:  Under 1 hour.  BLOOD LOSS:  Minimal.  ANTIBIOTICS:  2 g IV Ancef.  COMPLICATIONS:  None.  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  He had a closed reduction and splinting performed appropriately by the ER staff.  We then saw him in followup in the office.  We are presenting at this point for surgery having let the soft tissue swelling calm down.  He understands fully the risks and benefits of surgery and why are recommending this and informed consent has been obtained.  PROCEDURE DESCRIPTION:  After informed consent was obtained, appropriate right ankle was marked.  He was brought to the operating room after regional anesthesia was obtained.  A nonsterile tourniquet was placed around his upper right leg and general anesthesia was obtained.  A bump was placed under his 26 year old hip.  His right foot and ankle were prepped and draped with DuraPrep and sterile drapes.  Time-out was called to identify to identify the correct patient and correct right ankle.  We then used Esmarch to wrap out the leg and tourniquet was inflated to  300 mm of pressure.  We then made an incision over the fibula on the lateral aspect of his ankle and carried this proximally and distally.  We dissected down to the fibula and then we were able to expose the fracture.  Once we irrigated the fracture and removed hematoma, we were able to easily use reduction forceps to reduce the fibular fracture anatomically.  We then placed a single lag screw from anterior to posterior and then a [employer] VariAx ankle plate, which was a 6-hole straight fibular plate on the lateral aspect of his ankle.  We secured this with bicortical screws proximally and cancellous screws distally. We then stressed the ankle mortise and found to be intact.  His medial malleolus piece was nondisplaced, I felt comfortable placing a single screw.  We made a small incision medially and then placed temporary guide pin under direct fluoroscopy, traversing the fracture.  We placed a 50 mm in length 4.0 cancellous partially threaded screw to reduce this fracture nicely.  Again, we stressed the ankle mortise and it was intact.  We then irrigated the soft tissue on both wounds with normal saline solution.  We closed the deep tissue with 0 Vicryl on both sides followed by 2-0 Vicryl and interrupted 2-0 nylon on the skin on both sides.  We placed well-padded sterile dressing, the tourniquet was let down.  His toes pinked nicely.  He was awakened, extubated, and taken to the recovery room in stable condition.  All final counts were correct. There were no complications noted.     Lee Scott, M.D.   ______________________________ Lee Scott, M.D.    CYB/MEDQ  D:  08/23/2016  T:  08/23/2016  Job:  [id number]

## 2016-08-23 NOTE — H&P (Signed)
Kathreen CosierChristopher Pullin is an 26 y.o. male.   Chief Complaint:   Right ankle pain with known severe fracture HPI:  26 yo male who sustained a right severe ankle fracture-dislocation.  He now presents for definitive fixation of this unstable injury.  History reviewed. No pertinent past medical history.  Past Surgical History:  Procedure Laterality Date  . MULTIPLE TOOTH EXTRACTIONS      History reviewed. No pertinent family history. Social History:  reports that he has been smoking Cigarettes.  He has been smoking about 0.25 packs per day. He has never used smokeless tobacco. He reports that he drinks alcohol. He reports that he does not use drugs.  Allergies:  Allergies  Allergen Reactions  . No Known Allergies     Medications Prior to Admission  Medication Sig Dispense Refill  . acetaminophen (TYLENOL) 500 MG tablet Take 1,000 mg by mouth every 6 (six) hours as needed.       No results found for this or any previous visit (from the past 48 hour(s)). No results found.  Review of Systems  All other systems reviewed and are negative.   Blood pressure (!) 151/92, pulse 72, temperature 98.6 F (37 C), temperature source Oral, resp. rate 18, height 6' (1.829 m), weight 159 lb 7 oz (72.3 kg), SpO2 100 %. Physical Exam  Constitutional: He is oriented to person, place, and time. He appears well-developed and well-nourished.  HENT:  Head: Normocephalic and atraumatic.  Eyes: Pupils are equal, round, and reactive to light. EOM are normal.  Neck: Normal range of motion. Neck supple.  Cardiovascular: Normal rate and regular rhythm.   Respiratory: Effort normal and breath sounds normal.  GI: Soft. Bowel sounds are normal.  Musculoskeletal:       Right ankle: He exhibits decreased range of motion, swelling, ecchymosis and deformity. Tenderness. Lateral malleolus and medial malleolus tenderness found.  Neurological: He is alert and oriented to person, place, and time.  Skin: Skin is warm  and dry.  Psychiatric: He has a normal mood and affect.     Assessment/Plan Right unstable trimalleolar ankle fracture 1)  To the OR today for surgical fixation/stabilization of his right ankle.  Risks and benefits have been discussed in detail and informed consent is obtained.  Kathryne Hitchhristopher Y Abdur Hoglund, MD 08/23/2016, 12:06 PM

## 2016-08-23 NOTE — Progress Notes (Signed)
Orthopedic Tech Progress Note Patient Details:  Lee CosierChristopher Scott 05/21/1990 [id number]  Ortho Devices Type of Ortho Device: CAM walker Ortho Device/Splint Interventions: Application   [name] 08/23/2016, 2:04 PM

## 2016-08-23 NOTE — Discharge Instructions (Signed)
No weight on your right ankle at all. Expect swelling - ice and elevation as needed. Wear the Cam Walker Boot at all times when up. You do not have to sleep in the boot. Keep your dressings clean and dry.

## 2016-08-23 NOTE — Transfer of Care (Signed)
Immediate Anesthesia Transfer of Care Note  Patient: Lee CosierChristopher Scott  Procedure(s) Performed: Procedure(s): OPEN REDUCTION INTERNAL FIXATION (ORIF) RIGHT TRIMALLEOLAR ANKLE FRACTURE (Right)  Patient Location: PACU  Anesthesia Type:General  Level of Consciousness: awake, alert  and patient cooperative  Airway & Oxygen Therapy: Patient Spontanous Breathing  Post-op Assessment: Report given to RN and Post -op Vital signs reviewed and stable  Post vital signs: Reviewed and stable  Last Vitals:  Vitals:   08/23/16 0925  BP: (!) 151/92  Pulse: 72  Resp: 18  Temp: 37 C    Last Pain:  Vitals:   08/23/16 0925  TempSrc: Oral         Complications: No apparent anesthesia complications

## 2016-08-23 NOTE — Anesthesia Procedure Notes (Signed)
Anesthesia Regional Block: Popliteal block   Pre-Anesthetic Checklist: ,, timeout performed, Correct Patient, Correct Site, Correct Laterality, Correct Procedure, Correct Position, site marked, Risks and benefits discussed, pre-op evaluation,  At surgeon's request and post-op pain management  Laterality: Right  Prep: Maximum Sterile Barrier Precautions used, chloraprep       Needles:  Injection technique: Single-shot  Needle Type: Echogenic Stimulator Needle     Needle Length: 9cm  Needle Gauge: 21     Additional Needles:   Procedures: ultrasound guided, nerve stimulator,,,,,,   Nerve Stimulator or Paresthesia:  Response: Peroneal,  Response: Tibial,   Additional Responses:   Narrative:  Start time: 08/23/2016 11:10 AM End time: 08/23/2016 11:20 AM Injection made incrementally with aspirations every 5 mL. Anesthesiologist: Gaynelle AduFITZGERALD, Dashanae Longfield  Additional Notes: 2% Lidocaine skin wheel. Saphenous block with 10cc of 0.5% Bupivicaine plain.

## 2016-08-23 NOTE — Brief Op Note (Signed)
08/23/2016  1:29 PM  PATIENT:  Kathreen Cosierhristopher Weatherspoon  26 y.o. male  PRE-OPERATIVE DIAGNOSIS:  right ankle fracture/dislocation  POST-OPERATIVE DIAGNOSIS:  right ankle fracture/dislocation  PROCEDURE:  Procedure(s): OPEN REDUCTION INTERNAL FIXATION (ORIF) RIGHT TRIMALLEOLAR ANKLE FRACTURE (Right)  SURGEON:  Surgeon(s) and Role:    Kathryne Hitch* Donjuan Robison, Beniah Y, MD - Primary  ANESTHESIA:   regional and general  EBL:  Total I/O In: 1000 [I.V.:1000] Out: 10 [Blood:10]  COUNTS:  YES  TOURNIQUET:   Total Tourniquet Time Documented: Thigh (Right) - 42 minutes Total: Thigh (Right) - 42 minutes   DICTATION: .Other Dictation: Dictation Number [id number]  PLAN OF CARE: Discharge to home after PACU  PATIENT DISPOSITION:  PACU - hemodynamically stable.   Delay start of Pharmacological VTE agent (>24hrs) due to surgical blood loss or risk of bleeding: no

## 2016-08-23 NOTE — Anesthesia Procedure Notes (Signed)
Procedure Name: LMA Insertion Date/Time: 08/23/2016 12:23 PM Performed by: Rosiland OzMEYERS, Wyllow Seigler Pre-anesthesia Checklist: Emergency Drugs available, Patient identified, Suction available, Patient being monitored and Timeout performed Patient Re-evaluated:Patient Re-evaluated prior to induction Oxygen Delivery Method: Circle system utilized Preoxygenation: Pre-oxygenation with 100% oxygen Induction Type: IV induction LMA: LMA inserted LMA Size: 4.0 Number of attempts: 1 Placement Confirmation: positive ETCO2 and breath sounds checked- equal and bilateral Tube secured with: Tape Dental Injury: Teeth and Oropharynx as per pre-operative assessment

## 2016-08-23 NOTE — Op Note (Deleted)
  The note originally documented on this encounter has been moved the the encounter in which it belongs.  

## 2016-08-23 NOTE — Anesthesia Preprocedure Evaluation (Addendum)
Anesthesia Evaluation  Patient identified by MRN, date of birth, ID band Patient awake    Reviewed: Allergy & Precautions, H&P , NPO status , Patient's Chart, lab work & pertinent test results  Airway Mallampati: II  TM Distance: >3 FB Neck ROM: Full    Dental no notable dental hx. (+) Teeth Intact, Dental Advisory Given   Pulmonary Current Smoker,    Pulmonary exam normal breath sounds clear to auscultation       Cardiovascular negative cardio ROS   Rhythm:Regular Rate:Normal     Neuro/Psych negative neurological ROS  negative psych ROS   GI/Hepatic negative GI ROS, Neg liver ROS,   Endo/Other  negative endocrine ROS  Renal/GU negative Renal ROS  negative genitourinary   Musculoskeletal   Abdominal   Peds  Hematology negative hematology ROS (+)   Anesthesia Other Findings   Reproductive/Obstetrics negative OB ROS                            Anesthesia Physical Anesthesia Plan  ASA: II  Anesthesia Plan: General   Post-op Pain Management:  Regional for Post-op pain   Induction: Intravenous  PONV Risk Score and Plan: 1 and Ondansetron, Dexamethasone and Midazolam  Airway Management Planned: LMA  Additional Equipment:   Intra-op Plan:   Post-operative Plan: Extubation in OR  Informed Consent: I have reviewed the patients History and Physical, chart, labs and discussed the procedure including the risks, benefits and alternatives for the proposed anesthesia with the patient or authorized representative who has indicated his/her understanding and acceptance.   Dental advisory given  Plan Discussed with: CRNA  Anesthesia Plan Comments:        Anesthesia Quick Evaluation

## 2016-08-25 ENCOUNTER — Encounter (HOSPITAL_COMMUNITY): Payer: Self-pay | Admitting: Orthopaedic Surgery

## 2016-09-06 ENCOUNTER — Ambulatory Visit (INDEPENDENT_AMBULATORY_CARE_PROVIDER_SITE_OTHER): Payer: Self-pay

## 2016-09-06 ENCOUNTER — Ambulatory Visit (INDEPENDENT_AMBULATORY_CARE_PROVIDER_SITE_OTHER): Payer: Self-pay | Admitting: Physician Assistant

## 2016-09-06 DIAGNOSIS — S82851D Displaced trimalleolar fracture of right lower leg, subsequent encounter for closed fracture with routine healing: Secondary | ICD-10-CM

## 2016-09-06 NOTE — Progress Notes (Signed)
[name] is now 2 weeks status post open reduction internal fixation right trimalleolar ankle fracture. He is using crutches to ambulate. He's been nonweightbearing. He is in a cam walker boot. He denies any shortness of breath chest pain or calf pain.  Physical exam: Right ankle stitches well approximated the lateral medial incisions. There is no signs of infection. Calf supple nontender. Dorsal pedal pulses 2+.  Radiographs right ankle 3 views: Talus is well located within the ankle mortise. No diastases. No hardware failure. Excellent reduction of the medial and lateral malleolus fractures.   Impression: Right trimalleolar ankle fracture status post ORIF 2 weeks  Plan: Sutures removed Steri-Strips applied. The work on scar tissue mobilization. He is able to shower incision wet no submerging wound. Placed him back in a cam walker boot is 50% weightbearing. We'll see him back in 1 month obtain 3 views of the right ankle at that time. Elevation wiggling toes encouraged.

## 2016-09-27 ENCOUNTER — Telehealth (INDEPENDENT_AMBULATORY_CARE_PROVIDER_SITE_OTHER): Payer: Self-pay | Admitting: Physician Assistant

## 2016-09-27 NOTE — Telephone Encounter (Signed)
Returned call to patient left message to return call °

## 2016-10-05 ENCOUNTER — Ambulatory Visit (INDEPENDENT_AMBULATORY_CARE_PROVIDER_SITE_OTHER): Payer: Self-pay | Admitting: Physician Assistant

## 2016-10-10 ENCOUNTER — Ambulatory Visit (INDEPENDENT_AMBULATORY_CARE_PROVIDER_SITE_OTHER): Payer: Self-pay

## 2016-10-10 ENCOUNTER — Encounter (INDEPENDENT_AMBULATORY_CARE_PROVIDER_SITE_OTHER): Payer: Self-pay | Admitting: Physician Assistant

## 2016-10-10 ENCOUNTER — Ambulatory Visit (INDEPENDENT_AMBULATORY_CARE_PROVIDER_SITE_OTHER): Payer: Self-pay | Admitting: Physician Assistant

## 2016-10-10 DIAGNOSIS — S82851D Displaced trimalleolar fracture of right lower leg, subsequent encounter for closed fracture with routine healing: Secondary | ICD-10-CM

## 2016-10-10 NOTE — Progress Notes (Signed)
   Office Visit Note   Patient: Lee Scott           Date of Birth: 10/03/1990           MRN: [id number] Visit Date: 10/10/2016              Requested by: No referring provider defined for this encounter. PCP: Patient, No Pcp Per   Assessment & Plan: Visit Diagnoses:  1. Closed displaced trimalleolar fracture of right lower leg with routine healing     Plan: Place him in an ASO brace weightbearing as tolerated for the next 2 weeks. Then he'll wear it only whenever he is outside the home for 2 weeks and then discontinue. He's work on range of motion strengthening the ankle. He may have to wear the boot occasionally he develops any pain. Questions encouraged and answered. We'll see him back in 1 month's no x-rays at that time.  Follow-Up Instructions: Return in about 4 weeks (around 11/07/2016).   Orders:  Orders Placed This Encounter  Procedures  . XR Ankle Complete Right   No orders of the defined types were placed in this encounter.     Procedures: No procedures performed   Clinical Data: No additional findings.   Subjective: Chief Complaint  Patient presents with  . Right Ankle - Routine Post Op    HPI Lee Scott returns today now 6 weeks status post open reduction internal fixation of a trimalleolar ankle fracture. He states overall he has some intermittent sharp pain but no is doing well. He is been about 50% weightbearing with crutches, despite being told that he can be weightbearing as tolerated. Review of Systems   Objective: Vital Signs: There were no vitals taken for this visit.  Physical Exam  Constitutional: He is oriented to person, place, and time. He appears well-developed and well-nourished. No distress.  Neurological: He is alert and oriented to person, place, and time.  Skin: He is not diaphoretic.    Ortho Exam He has slight limitations with dorsiflexion plantar flexion of the ankle. He's nontender over the medial lateral malleoli.  We'll lateral malleolus he has spit stitch is removed after Betadine prep today. Otherwise the incisions are healing well no signs of infection. Right calf supple nontender. Dorsal pedal pulses present. Specialty Comments:  No specialty comments available.  Imaging: Xr Ankle Complete Right  Result Date: 10/10/2016 Right ankle 3 views: Fractures are all well-healed. No hardware failure. Talus well located within the ankle mortise no diastases.    PMFS History: Patient Active Problem List   Diagnosis Date Noted  . Displaced trimalleolar fracture of right lower leg, initial encounter for closed fracture 08/17/2016   No past medical history on file.  No family history on file.  Past Surgical History:  Procedure Laterality Date  . MULTIPLE TOOTH EXTRACTIONS    . ORIF ANKLE FRACTURE Right 08/23/2016   Procedure: OPEN REDUCTION INTERNAL FIXATION (ORIF) RIGHT TRIMALLEOLAR ANKLE FRACTURE;  Surgeon: Lee Scott, Lee Y, MD;  Location: [facility] OR;  Service: Orthopedics;  Laterality: Right;   Social History   Occupational History  . Not on file.   Social History Main Topics  . Smoking status: Current Every Day Smoker    Packs/day: 0.25    Types: Cigarettes  . Smokeless tobacco: Never Used  . Alcohol use Yes     Comment: few drinks a week  . Drug use: No  . Sexual activity: Not on file

## 2016-11-07 ENCOUNTER — Ambulatory Visit (INDEPENDENT_AMBULATORY_CARE_PROVIDER_SITE_OTHER): Payer: Self-pay | Admitting: Physician Assistant

## 2016-11-07 ENCOUNTER — Encounter (INDEPENDENT_AMBULATORY_CARE_PROVIDER_SITE_OTHER): Payer: Self-pay | Admitting: Physician Assistant

## 2016-11-07 VITALS — Ht 72.0 in | Wt 150.0 lb

## 2016-11-07 DIAGNOSIS — Z9889 Other specified postprocedural states: Principal | ICD-10-CM

## 2016-11-07 DIAGNOSIS — Z967 Presence of other bone and tendon implants: Secondary | ICD-10-CM

## 2016-11-07 DIAGNOSIS — Z8781 Personal history of (healed) traumatic fracture: Secondary | ICD-10-CM

## 2016-11-07 NOTE — Progress Notes (Signed)
   Office Visit Note   Patient: Lee Scott           Date of Birth: 10-01-1990           MRN: [id number] Visit Date: 11/07/2016              Requested by: No referring provider defined for this encounter. PCP: Patient, No Pcp Per   Assessment & Plan: Visit Diagnoses:  1. Status post ORIF of fracture of ankle     .Plan: He'll continue to work on range of motion strengthening the ankle also on proprioception. He'll discontinue the ankle brace over the next 2 weeks entirely. He may have some swelling for up to 6 months the ankle. If he develops any mechanical symptoms or significant pain he'll return otherwise follow up when necessary.  Follow-Up Instructions: Return if symptoms worsen or fail to improve.   Orders:  No orders of the defined types were placed in this encounter.  No orders of the defined types were placed in this encounter.     Procedures: No procedures performed   Clinical Data: No additional findings.   Subjective: Chief Complaint  Patient presents with  . Right Ankle - Follow-up    HPI Lee Scott returns today now 76 days status post open reduction internal fixation of her right bimalleolar fracture. He states overall he is doing well. He continued ASO brace states the ankle feels weak but he's having no mechanical symptoms of the ankle. He does have some swelling particularly in the morning. Review of Systems Negative outside of history of present illness  Objective: Vital Signs: Ht 6' (1.829 m)   Wt 150 lb (68 kg)   BMI 20.34 kg/m   Physical Exam  Constitutional: He is oriented to person, place, and time. He appears well-developed and well-nourished. No distress.  Cardiovascular: Intact distal pulses.   Neurological: He is alert and oriented to person, place, and time.  Skin: He is not diaphoretic.    Ortho Exam Right ankle full dorsiflexion plantar flexion without pain. Inversion eversion 5 out of 5 strengths. Nontender posterior  tibial tendon and peroneal tendons. Nontender over the Achilles. Surgical incisions all well-healed. No signs of infection. Calf supple nontender Specialty Comments:  No specialty comments available.  Imaging: No results found.   PMFS History: Patient Active Problem List   Diagnosis Date Noted  . Displaced trimalleolar fracture of right lower leg, initial encounter for closed fracture 08/17/2016   No past medical history on file.  No family history on file.  Past Surgical History:  Procedure Laterality Date  . MULTIPLE TOOTH EXTRACTIONS    . ORIF ANKLE FRACTURE Right 08/23/2016   Procedure: OPEN REDUCTION INTERNAL FIXATION (ORIF) RIGHT TRIMALLEOLAR ANKLE FRACTURE;  Surgeon: Kathryne Hitch, MD;  Location: [facility] OR;  Service: Orthopedics;  Laterality: Right;   Social History   Occupational History  . Not on file.   Social History Main Topics  . Smoking status: Current Every Day Smoker    Packs/day: 0.25    Types: Cigarettes  . Smokeless tobacco: Never Used  . Alcohol use Yes     Comment: few drinks a week  . Drug use: No  . Sexual activity: Not on file

## 2017-10-26 IMAGING — CR DG ANKLE COMPLETE 3+V*R*
3 series · 3 of 3 positions shown · non-contrast
Comparison: None.

CLINICAL DATA: Slip and fall all off of skateboard with right ankle
deformity, initial encounter

EXAM:
RIGHT ANKLE - COMPLETE 3+ VIEW

[x ankle lat right]
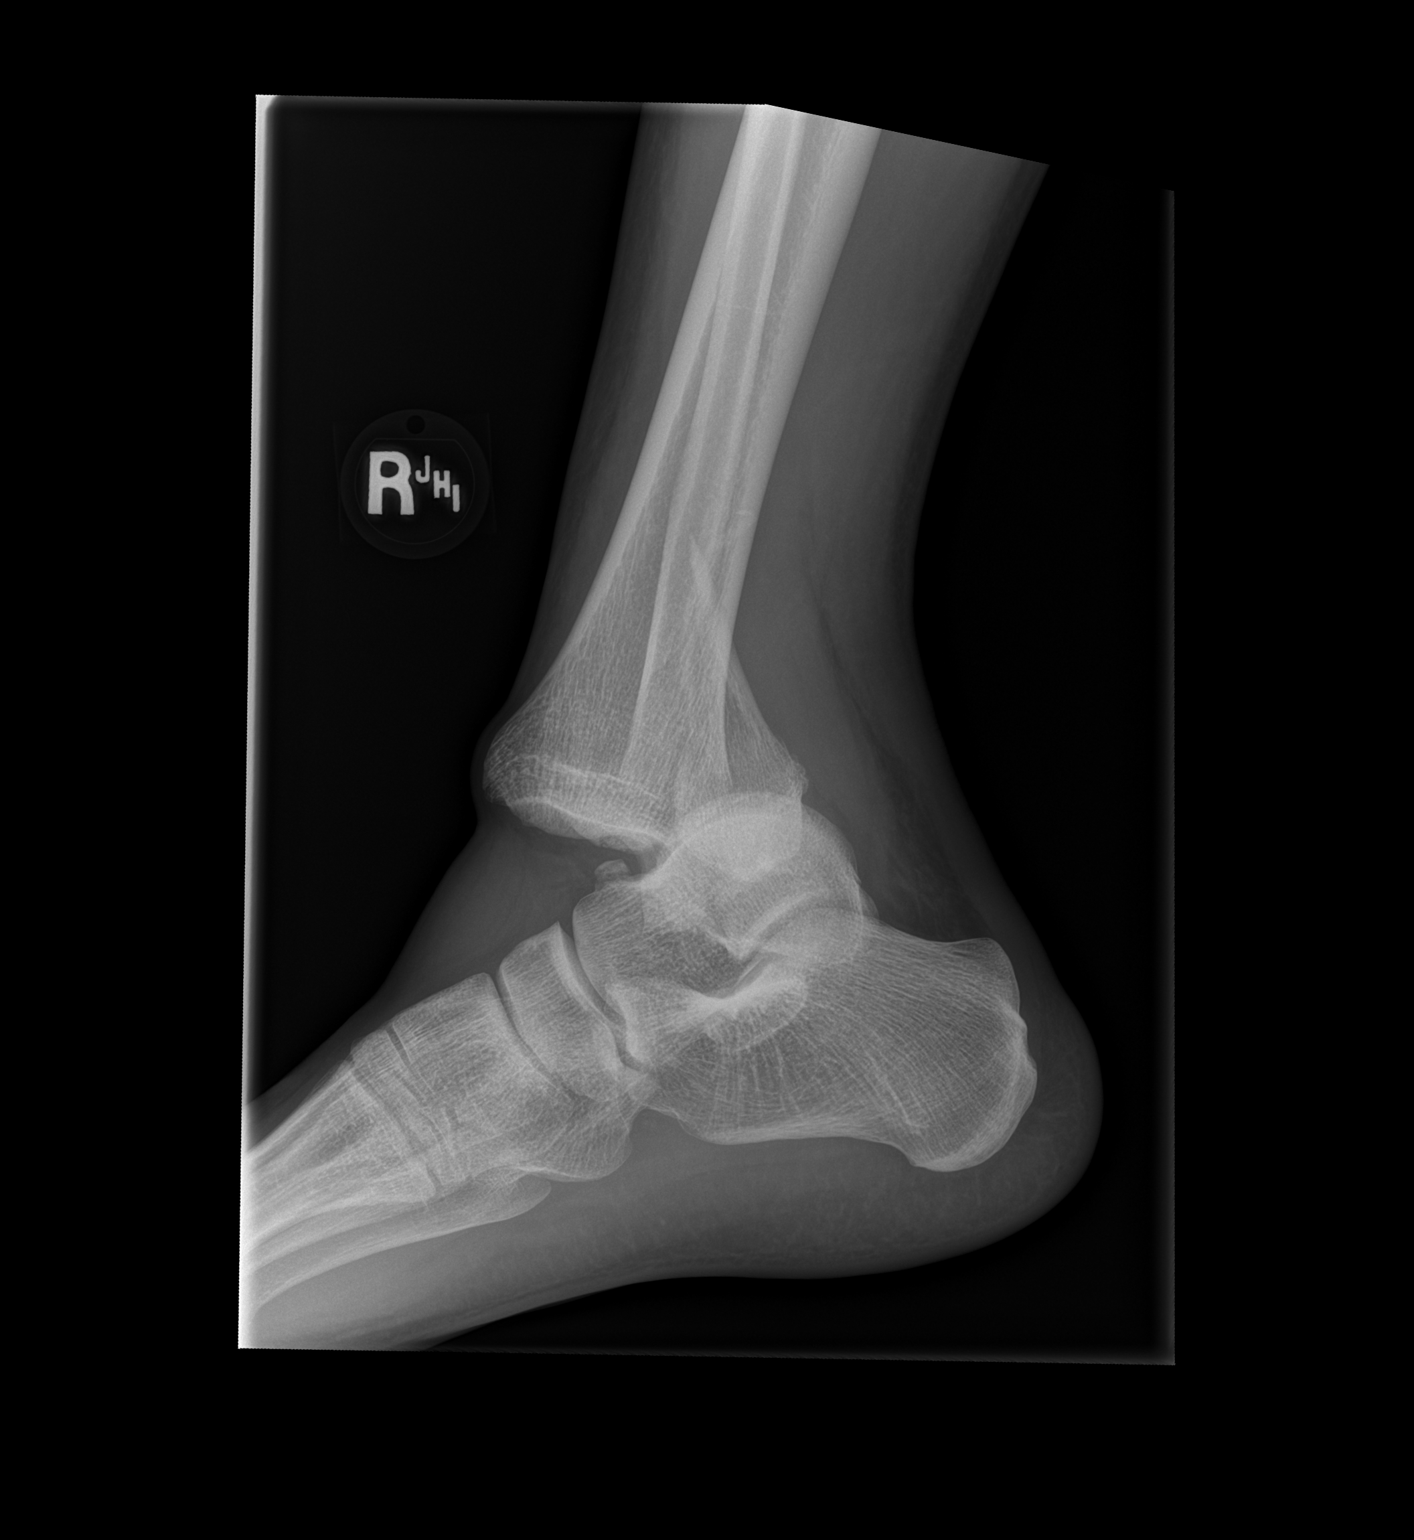

[x ankle ap right]
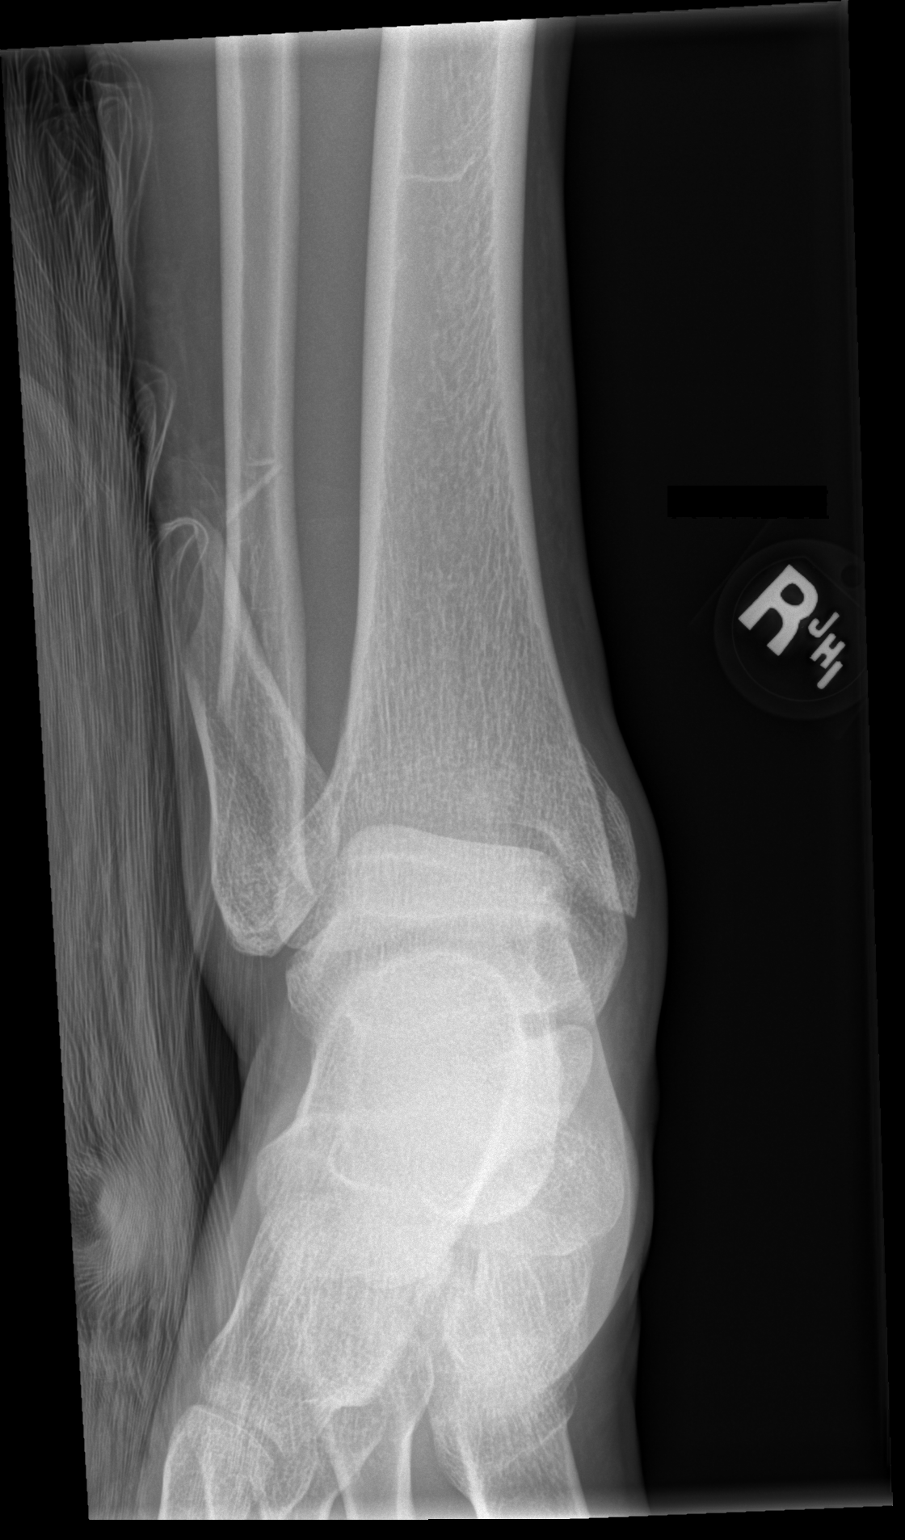

[x ankle obl right]
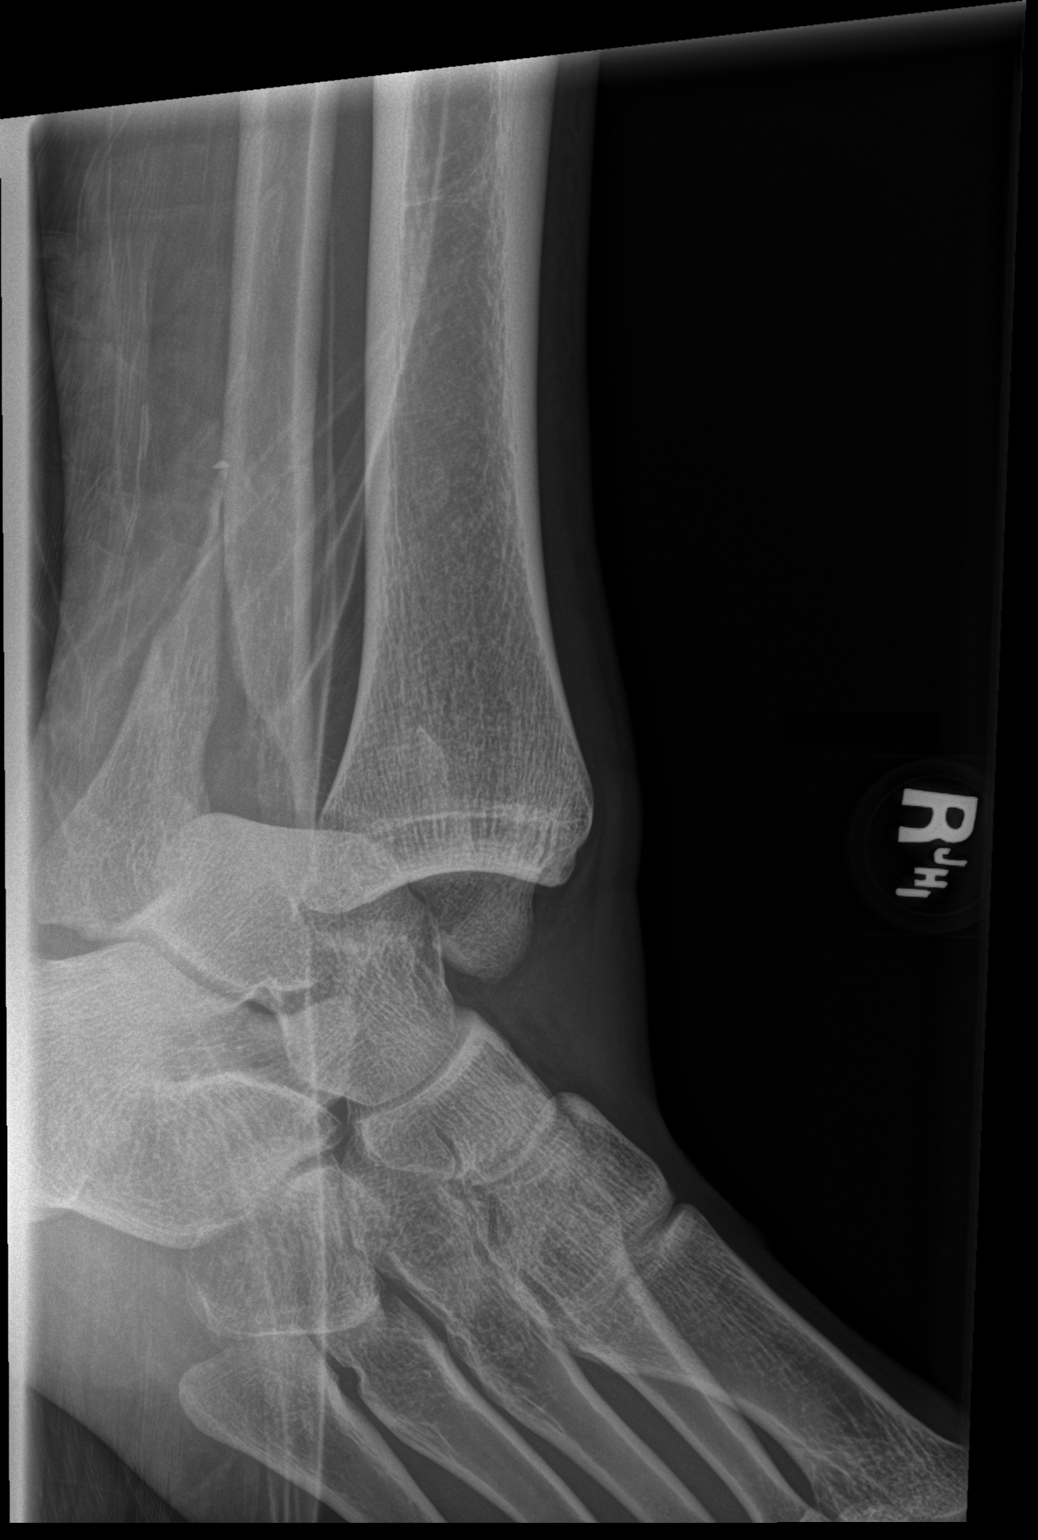

[3 of 3 positions shown; findings below may reference images not displayed]

FINDINGS: There is an oblique fracture through the distal fibular diaphysis
with some impaction at the fracture site. Fracture through the
lateral malleolus is also noted. There is posterior dislocation of
the talus with respect to the distal tibia. No other fracture is
identified.
IMPRESSION: Fracture dislocation of the right ankle joint with posterior
displacement of the talus with respect to the distal tibia. Distal
tibial and fibular fractures are seen.

## 2017-11-04 IMAGING — RF DG C-ARM 61-120 MIN
1 series · 3 of 3 positions shown · non-contrast
Comparison: 08/14/2016 .

CLINICAL DATA: ORIF.

EXAM:
RIGHT ANKLE - COMPLETE 3+ VIEW; DG C-ARM 61-120 MIN

[Series 1: run · 3 of 3 slices shown]
[im 1/3]
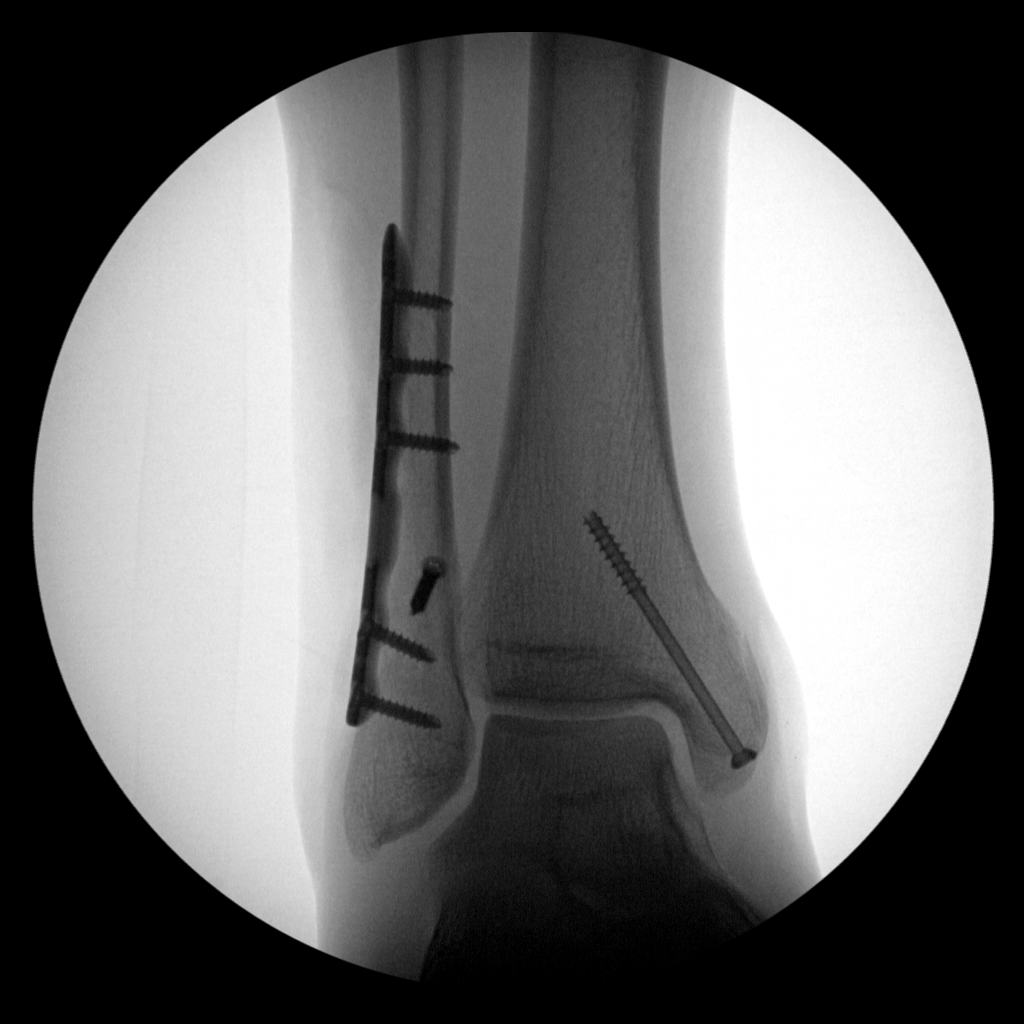
[im 2/3]
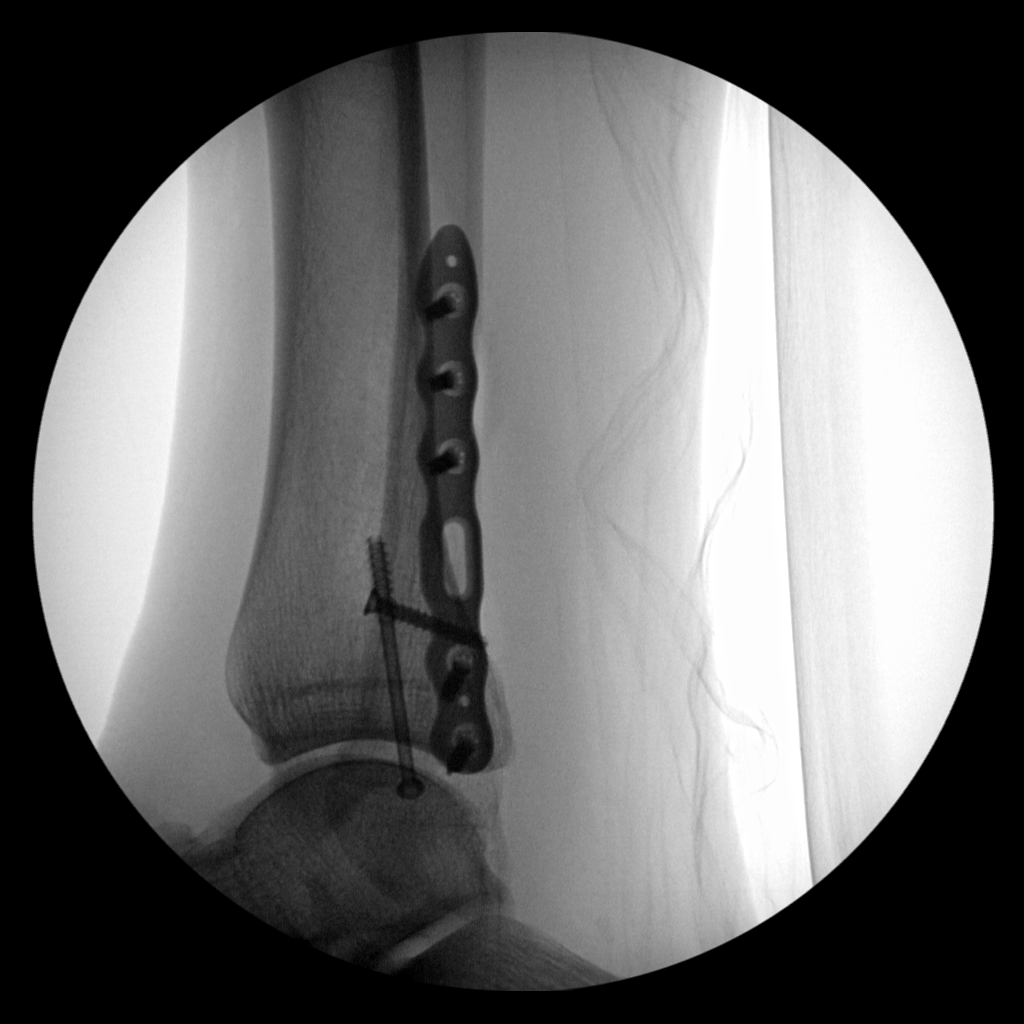
[im 3/3]
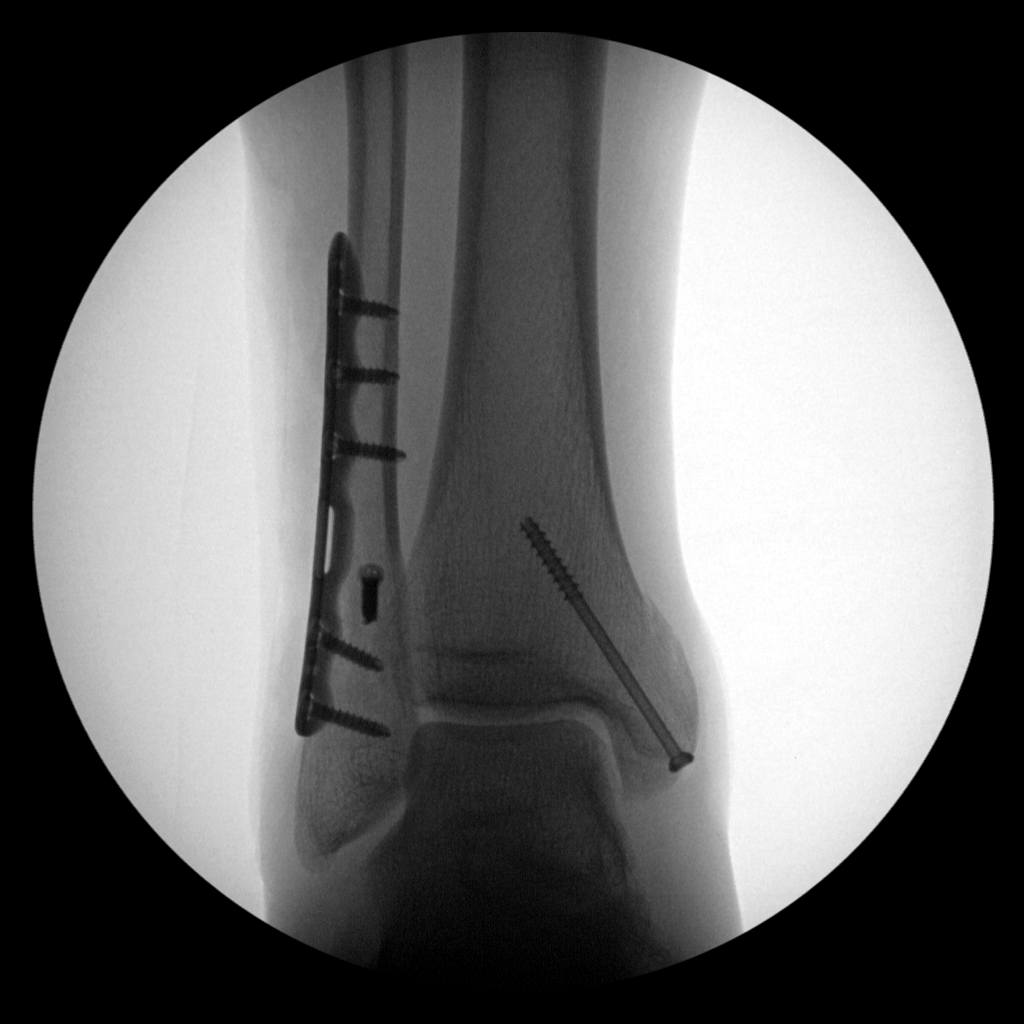

[3 of 3 positions shown; findings below may reference images not displayed]

FINDINGS: ORIF medial malleolus and fibula. Hardware intact. Anatomic
alignment . Lucency noted along the medial most aspect of the medial
malleolus. An acute fracture of the medial malleolus cannot be
excluded.
IMPRESSION: 1.  ORIF right ankle.  Hardware intact.  Anatomic alignment.

2. Noted is a lucency along the medial most portion of the medial
malleolus suggesting an acute medial malleolar fracture.
